# Patient Record
Sex: Female | Born: 1970 | Race: White | Hispanic: No | Marital: Married | Smoking: Never smoker
Health system: Southern US, Community
[De-identification: ages and names within clinical notes are randomized; demographics above are authoritative.]

## PROBLEM LIST (undated history)

## (undated) HISTORY — PX: OVARIAN CYST REMOVAL: SHX89

---

## 2014-01-01 DIAGNOSIS — I2789 Other specified pulmonary heart diseases: Secondary | ICD-10-CM

## 2014-01-01 DIAGNOSIS — I509 Heart failure, unspecified: Secondary | ICD-10-CM

## 2014-01-01 DIAGNOSIS — I2510 Atherosclerotic heart disease of native coronary artery without angina pectoris: Secondary | ICD-10-CM

## 2014-01-01 DIAGNOSIS — I10 Essential (primary) hypertension: Secondary | ICD-10-CM

## 2014-02-04 ENCOUNTER — Other Ambulatory Visit: Payer: Self-pay | Admitting: *Deleted

## 2014-02-04 NOTE — Telephone Encounter (Deleted)
Nancy called requesting VO to show pt how to use the TENS units that pt bought otc. Verbal order given to Nancy & she wanted to let you know that Ms. Coomer has an appt with [facility] on Monday 

## 2016-08-21 ENCOUNTER — Other Ambulatory Visit: Payer: Self-pay | Admitting: Family Medicine

## 2016-08-21 DIAGNOSIS — Z1231 Encounter for screening mammogram for malignant neoplasm of breast: Secondary | ICD-10-CM

## 2020-10-18 ENCOUNTER — Other Ambulatory Visit: Payer: Self-pay | Admitting: Obstetrics & Gynecology

## 2020-10-18 DIAGNOSIS — Z1231 Encounter for screening mammogram for malignant neoplasm of breast: Secondary | ICD-10-CM

## 2020-10-27 ENCOUNTER — Other Ambulatory Visit: Payer: Self-pay

## 2020-10-27 ENCOUNTER — Ambulatory Visit
Admission: RE | Admit: 2020-10-27 | Discharge: 2020-10-27 | Disposition: A | Discharge status: Home / Self Care | Payer: No Typology Code available for payment source | Source: Ambulatory Visit | Attending: Obstetrics & Gynecology | Admitting: Obstetrics & Gynecology

## 2020-10-27 DIAGNOSIS — Z1231 Encounter for screening mammogram for malignant neoplasm of breast: Secondary | ICD-10-CM | POA: Diagnosis not present

## 2020-11-03 ENCOUNTER — Other Ambulatory Visit: Payer: Self-pay | Admitting: Obstetrics & Gynecology

## 2020-11-03 DIAGNOSIS — N6310 Unspecified lump in the right breast, unspecified quadrant: Secondary | ICD-10-CM

## 2020-11-03 DIAGNOSIS — R928 Other abnormal and inconclusive findings on diagnostic imaging of breast: Secondary | ICD-10-CM

## 2020-11-03 DIAGNOSIS — R921 Mammographic calcification found on diagnostic imaging of breast: Secondary | ICD-10-CM

## 2020-11-03 DIAGNOSIS — N6320 Unspecified lump in the left breast, unspecified quadrant: Secondary | ICD-10-CM

## 2020-11-14 ENCOUNTER — Ambulatory Visit
Admission: RE | Admit: 2020-11-14 | Discharge: 2020-11-14 | Disposition: A | Discharge status: Home / Self Care | Payer: No Typology Code available for payment source | Source: Ambulatory Visit | Attending: Obstetrics & Gynecology | Admitting: Obstetrics & Gynecology

## 2020-11-14 ENCOUNTER — Other Ambulatory Visit: Payer: Self-pay

## 2020-11-14 DIAGNOSIS — N6310 Unspecified lump in the right breast, unspecified quadrant: Secondary | ICD-10-CM

## 2020-11-14 DIAGNOSIS — N6320 Unspecified lump in the left breast, unspecified quadrant: Secondary | ICD-10-CM | POA: Diagnosis present

## 2020-11-14 DIAGNOSIS — R928 Other abnormal and inconclusive findings on diagnostic imaging of breast: Secondary | ICD-10-CM

## 2020-11-14 DIAGNOSIS — R921 Mammographic calcification found on diagnostic imaging of breast: Secondary | ICD-10-CM

## 2021-04-10 ENCOUNTER — Other Ambulatory Visit: Payer: Self-pay | Admitting: Obstetrics and Gynecology

## 2021-04-10 DIAGNOSIS — R921 Mammographic calcification found on diagnostic imaging of breast: Secondary | ICD-10-CM

## 2021-05-18 ENCOUNTER — Ambulatory Visit
Admission: RE | Admit: 2021-05-18 | Discharge: 2021-05-18 | Disposition: A | Discharge status: Home / Self Care | Payer: PRIVATE HEALTH INSURANCE | Source: Ambulatory Visit | Attending: Obstetrics and Gynecology | Admitting: Obstetrics and Gynecology

## 2021-05-18 ENCOUNTER — Other Ambulatory Visit: Payer: Self-pay

## 2021-05-18 DIAGNOSIS — R921 Mammographic calcification found on diagnostic imaging of breast: Secondary | ICD-10-CM | POA: Diagnosis not present

## 2021-10-04 ENCOUNTER — Other Ambulatory Visit: Payer: Self-pay | Admitting: Obstetrics and Gynecology

## 2021-10-04 DIAGNOSIS — R921 Mammographic calcification found on diagnostic imaging of breast: Secondary | ICD-10-CM

## 2021-10-09 ENCOUNTER — Other Ambulatory Visit: Payer: Self-pay | Admitting: Obstetrics and Gynecology

## 2021-10-09 DIAGNOSIS — R921 Mammographic calcification found on diagnostic imaging of breast: Secondary | ICD-10-CM

## 2021-11-15 ENCOUNTER — Other Ambulatory Visit: Payer: Self-pay

## 2021-11-15 ENCOUNTER — Ambulatory Visit
Admission: RE | Admit: 2021-11-15 | Discharge: 2021-11-15 | Disposition: A | Discharge status: Home / Self Care | Payer: PRIVATE HEALTH INSURANCE | Source: Ambulatory Visit | Attending: Obstetrics and Gynecology | Admitting: Obstetrics and Gynecology

## 2021-11-15 DIAGNOSIS — R921 Mammographic calcification found on diagnostic imaging of breast: Secondary | ICD-10-CM | POA: Diagnosis present

## 2021-12-20 IMAGING — MG DIGITAL DIAGNOSTIC BILAT W/ TOMO W/ CAD
8 of 14 series · 8 of 38 positions shown · non-contrast
Comparison: Previous exam(s).

CLINICAL DATA: Patient returns after screening study for evaluation
of LEFT breast calcifications and bilateral breast masses.

EXAM:
DIGITAL DIAGNOSTIC BILATERAL MAMMOGRAM WITH CAD AND TOMO
ULTRASOUND RIGHT BREAST

[L ML]
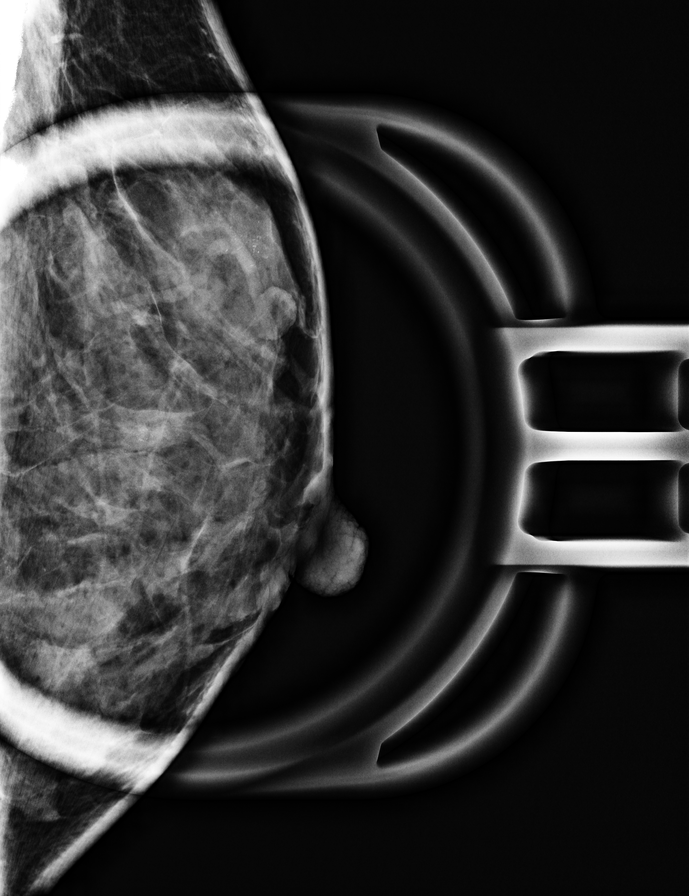

[L CC]
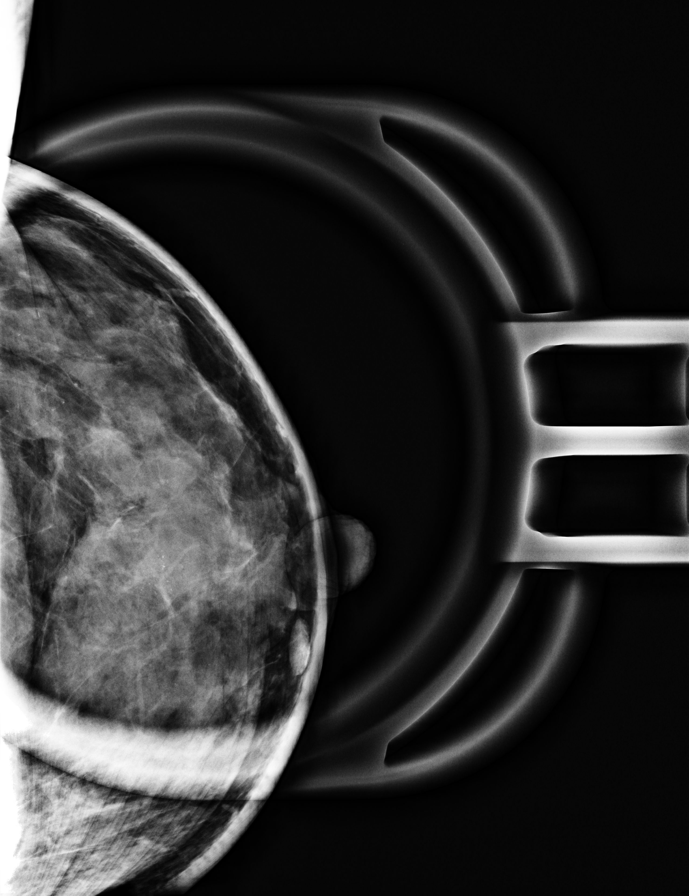

[L LM synth-2D]
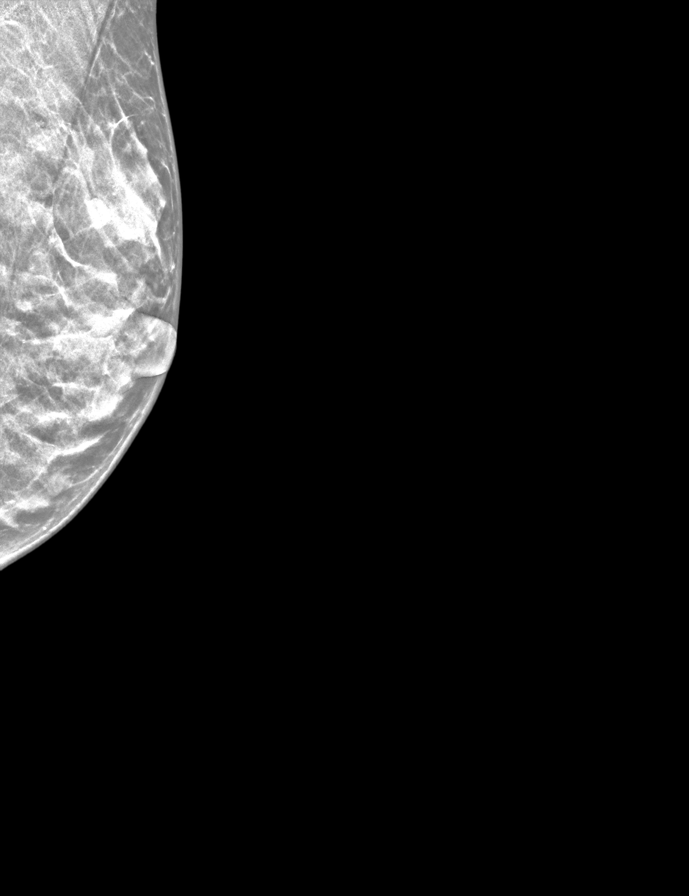

[R MLO synth-2D]
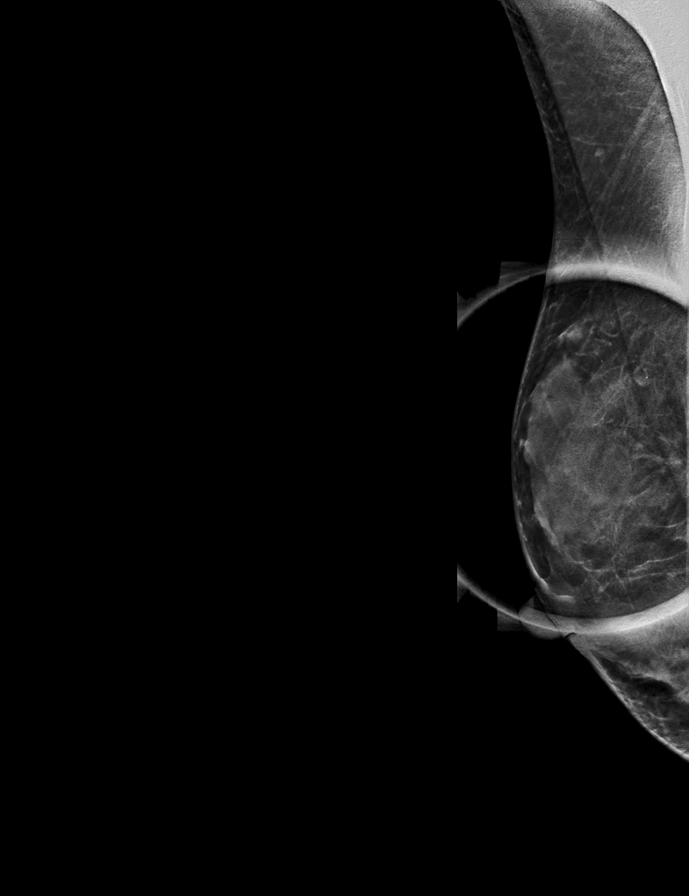

[L CC synth-2D]
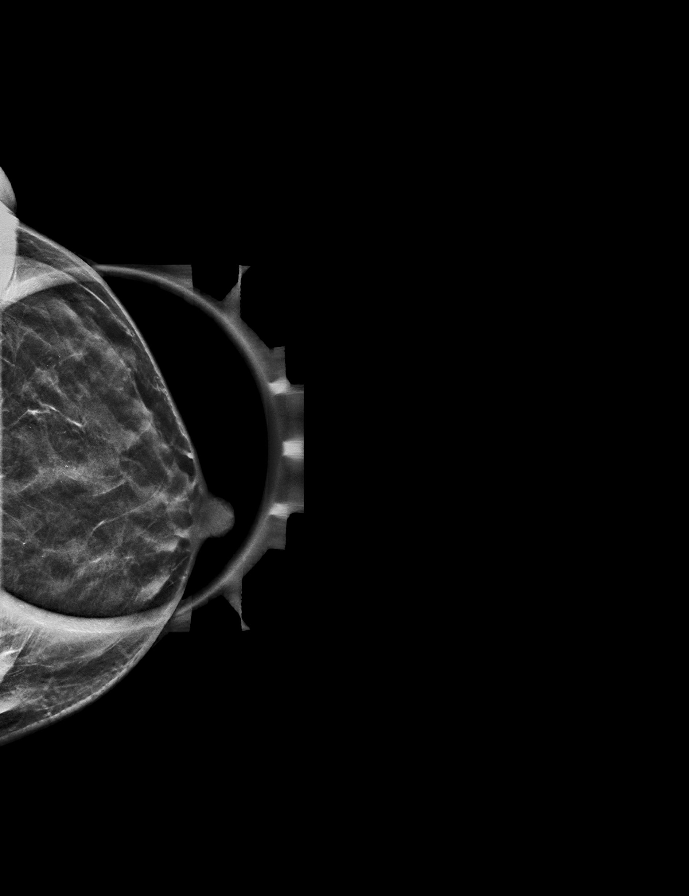

[R CC synth-2D]
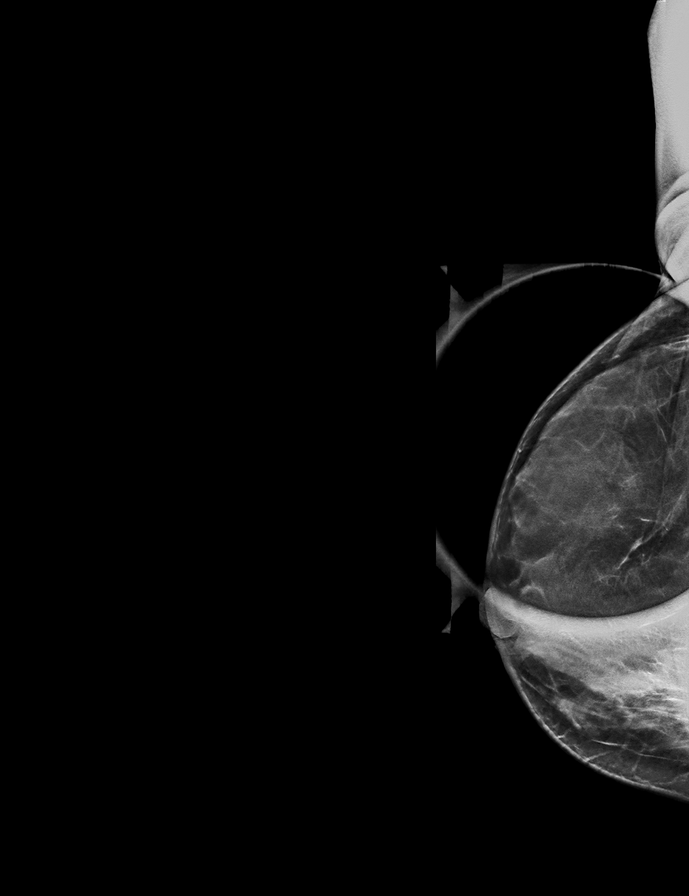

[L MLO synth-2D]
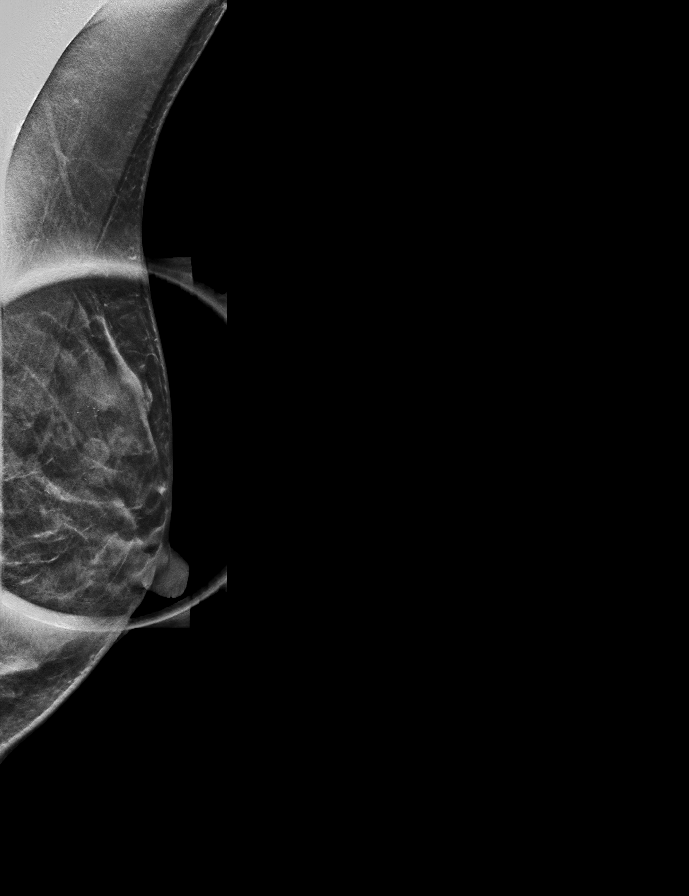

[L ML synth-2D]
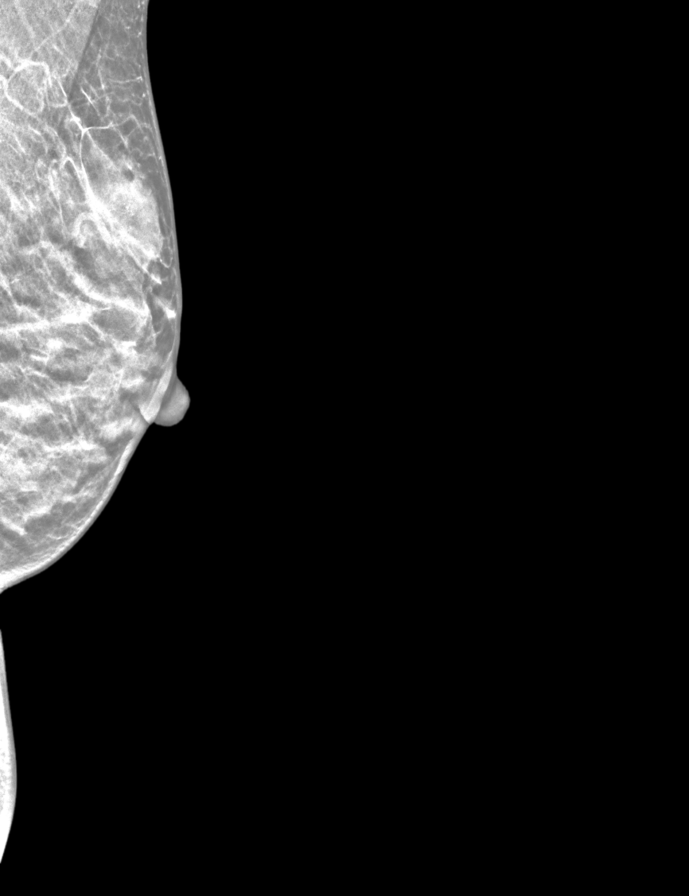

[8 of 38 positions shown; findings below may reference images not displayed]

ACR Breast Density Category c: The breast tissue is heterogeneously
dense, which may obscure small masses.
FINDINGS: RIGHT BREAST:

Mammogram: Additional 2-D and 3-D images are performed. These views
confirm presence of an obscured oval mass in the LATERAL aspect of
the RIGHT breast. Mammographic images were processed with CAD.

Ultrasound: Targeted ultrasound is performed, showing a simple cyst
in the 10 o'clock location of the RIGHT breast 4 centimeters from
the nipple measuring 3.0 x 3.0 x 1.2 centimeters. No solid component
or areas of acoustic shadowing.

LEFT BREAST:

Mammogram: Magnified views are performed of calcifications in the
UPPER central portion of the LEFT breast. In this region, there is a
group of calcifications spanning 8 millimeters. Calcifications are
primarily punctate on craniocaudal view and primarily show layering
on true LATERAL projection, consistent with milk of calcium. There
is no suspicious morphology or distribution of calcifications. Other
scattered calcifications show benign morphology. A circumscribed
oval mass in the UPPER portion of the LEFT breast is a mole,
confirmed on spot views today. Mammographic images were processed
with CAD.
IMPRESSION: 1. Simple cyst in the RIGHT breast.
2. Benign-appearing LEFT breast calcifications.
3. Mole in the UPPER central LEFT breast accounts for the questioned
mass seen mammographically.

RECOMMENDATION:
Recommend LEFT diagnostic mammogram in 6 months.

I have discussed the findings and recommendations with the patient.
If applicable, a reminder letter will be sent to the patient
regarding the next appointment.

BI-RADS CATEGORY  2: Benign.

## 2022-09-18 ENCOUNTER — Other Ambulatory Visit: Payer: Self-pay | Admitting: Obstetrics and Gynecology

## 2022-09-20 ENCOUNTER — Other Ambulatory Visit: Payer: Self-pay | Admitting: Obstetrics and Gynecology

## 2022-09-20 DIAGNOSIS — R921 Mammographic calcification found on diagnostic imaging of breast: Secondary | ICD-10-CM

## 2022-11-22 ENCOUNTER — Ambulatory Visit
Admission: RE | Admit: 2022-11-22 | Discharge: 2022-11-22 | Disposition: A | Discharge status: Home / Self Care | Payer: PRIVATE HEALTH INSURANCE | Source: Ambulatory Visit | Attending: Obstetrics and Gynecology | Admitting: Obstetrics and Gynecology

## 2022-11-22 DIAGNOSIS — R921 Mammographic calcification found on diagnostic imaging of breast: Secondary | ICD-10-CM | POA: Insufficient documentation

## 2023-01-09 ENCOUNTER — Other Ambulatory Visit: Payer: Self-pay | Admitting: Unknown Physician Specialty

## 2023-01-09 DIAGNOSIS — J3501 Chronic tonsillitis: Secondary | ICD-10-CM

## 2023-01-17 ENCOUNTER — Other Ambulatory Visit: Payer: PRIVATE HEALTH INSURANCE

## 2023-02-27 ENCOUNTER — Encounter: Payer: Self-pay | Admitting: Internal Medicine

## 2023-03-06 ENCOUNTER — Ambulatory Visit
Admission: RE | Admit: 2023-03-06 | Payer: PRIVATE HEALTH INSURANCE | Source: Home / Self Care | Admitting: Internal Medicine

## 2023-03-06 ENCOUNTER — Encounter: Admission: RE | Payer: Self-pay | Source: Home / Self Care

## 2023-03-06 SURGERY — COLONOSCOPY WITH PROPOFOL
Anesthesia: General

## 2023-03-12 ENCOUNTER — Inpatient Hospital Stay: Admission: RE | Admit: 2023-03-12 | Payer: PRIVATE HEALTH INSURANCE | Source: Ambulatory Visit

## 2023-03-19 ENCOUNTER — Ambulatory Visit
Admission: RE | Admit: 2023-03-19 | Discharge: 2023-03-19 | Disposition: A | Discharge status: Home / Self Care | Payer: PRIVATE HEALTH INSURANCE | Source: Ambulatory Visit | Attending: Unknown Physician Specialty | Admitting: Unknown Physician Specialty

## 2023-03-19 DIAGNOSIS — J3501 Chronic tonsillitis: Secondary | ICD-10-CM

## 2023-03-19 MED ORDER — IOPAMIDOL (ISOVUE-300) INJECTION 61%
75.0000 mL | Freq: Once | INTRAVENOUS | Status: AC | PRN
  Administered 2023-03-19: 75 mL via INTRAVENOUS

## 2023-10-22 ENCOUNTER — Other Ambulatory Visit: Payer: Self-pay | Admitting: Obstetrics and Gynecology

## 2023-10-22 DIAGNOSIS — Z1231 Encounter for screening mammogram for malignant neoplasm of breast: Secondary | ICD-10-CM

## 2023-11-25 ENCOUNTER — Ambulatory Visit
Admission: RE | Admit: 2023-11-25 | Discharge: 2023-11-25 | Disposition: A | Discharge status: Home / Self Care | Payer: PRIVATE HEALTH INSURANCE | Source: Ambulatory Visit | Attending: Obstetrics and Gynecology | Admitting: Obstetrics and Gynecology

## 2023-11-25 DIAGNOSIS — Z1231 Encounter for screening mammogram for malignant neoplasm of breast: Secondary | ICD-10-CM | POA: Insufficient documentation

## 2024-07-16 LAB — COLOGUARD: COLOGUARD: NEGATIVE

## 2024-11-03 ENCOUNTER — Other Ambulatory Visit: Payer: Self-pay | Admitting: Family Medicine

## 2024-11-03 DIAGNOSIS — Z1231 Encounter for screening mammogram for malignant neoplasm of breast: Secondary | ICD-10-CM

## 2024-12-04 ENCOUNTER — Ambulatory Visit
Admission: RE | Admit: 2024-12-04 | Discharge: 2024-12-04 | Disposition: A | Payer: PRIVATE HEALTH INSURANCE | Source: Ambulatory Visit | Attending: Family Medicine | Admitting: Family Medicine

## 2024-12-04 DIAGNOSIS — Z1231 Encounter for screening mammogram for malignant neoplasm of breast: Secondary | ICD-10-CM | POA: Insufficient documentation
